# Patient Record
Sex: Female | Born: 1945 | Race: White | Hispanic: No | Marital: Married | Smoking: Never smoker
Health system: Southern US, Community
[De-identification: ages and names within clinical notes are randomized; demographics above are authoritative.]

## PROBLEM LIST (undated history)

## (undated) DIAGNOSIS — E079 Disorder of thyroid, unspecified: Secondary | ICD-10-CM

## (undated) DIAGNOSIS — C73 Malignant neoplasm of thyroid gland: Secondary | ICD-10-CM

## (undated) DIAGNOSIS — I10 Essential (primary) hypertension: Secondary | ICD-10-CM

## (undated) DIAGNOSIS — M810 Age-related osteoporosis without current pathological fracture: Secondary | ICD-10-CM

## (undated) HISTORY — DX: Malignant neoplasm of thyroid gland: C73

## (undated) HISTORY — DX: Disorder of thyroid, unspecified: E07.9

## (undated) HISTORY — DX: Age-related osteoporosis without current pathological fracture: M81.0

## (undated) HISTORY — DX: Essential (primary) hypertension: I10

## (undated) HISTORY — PX: THYROID SURGERY: SHX805

---

## 1999-12-19 ENCOUNTER — Encounter: Admission: RE | Admit: 1999-12-19 | Discharge: 1999-12-19 | Payer: Self-pay | Admitting: Obstetrics and Gynecology

## 1999-12-19 ENCOUNTER — Encounter: Payer: Self-pay | Admitting: Obstetrics and Gynecology

## 2000-12-25 ENCOUNTER — Encounter: Payer: Self-pay | Admitting: Obstetrics and Gynecology

## 2000-12-25 ENCOUNTER — Encounter: Admission: RE | Admit: 2000-12-25 | Discharge: 2000-12-25 | Payer: Self-pay | Admitting: Obstetrics and Gynecology

## 2002-01-05 ENCOUNTER — Encounter: Payer: Self-pay | Admitting: Obstetrics and Gynecology

## 2002-01-05 ENCOUNTER — Encounter: Admission: RE | Admit: 2002-01-05 | Discharge: 2002-01-05 | Payer: Self-pay | Admitting: Obstetrics and Gynecology

## 2003-01-26 ENCOUNTER — Encounter: Admission: RE | Admit: 2003-01-26 | Discharge: 2003-01-26 | Payer: Self-pay | Admitting: Obstetrics and Gynecology

## 2003-01-26 ENCOUNTER — Encounter: Payer: Self-pay | Admitting: Obstetrics and Gynecology

## 2004-02-01 ENCOUNTER — Encounter: Admission: RE | Admit: 2004-02-01 | Discharge: 2004-02-01 | Payer: Self-pay | Admitting: Obstetrics and Gynecology

## 2004-03-13 ENCOUNTER — Other Ambulatory Visit: Admission: RE | Admit: 2004-03-13 | Discharge: 2004-03-13 | Payer: Self-pay | Admitting: Obstetrics and Gynecology

## 2004-04-05 ENCOUNTER — Observation Stay (HOSPITAL_COMMUNITY): Admission: RE | Admit: 2004-04-05 | Discharge: 2004-04-06 | Payer: Self-pay

## 2004-04-05 ENCOUNTER — Encounter (INDEPENDENT_AMBULATORY_CARE_PROVIDER_SITE_OTHER): Payer: Self-pay | Admitting: *Deleted

## 2004-06-15 ENCOUNTER — Encounter (INDEPENDENT_AMBULATORY_CARE_PROVIDER_SITE_OTHER): Payer: Self-pay | Admitting: Specialist

## 2004-06-15 ENCOUNTER — Observation Stay (HOSPITAL_COMMUNITY): Admission: RE | Admit: 2004-06-15 | Discharge: 2004-06-16 | Payer: Self-pay

## 2004-07-31 ENCOUNTER — Encounter (HOSPITAL_COMMUNITY): Admission: RE | Admit: 2004-07-31 | Discharge: 2004-10-29 | Payer: Self-pay | Admitting: Endocrinology

## 2005-02-08 ENCOUNTER — Encounter: Admission: RE | Admit: 2005-02-08 | Discharge: 2005-02-08 | Payer: Self-pay | Admitting: Obstetrics and Gynecology

## 2005-08-06 ENCOUNTER — Encounter (HOSPITAL_COMMUNITY): Admission: RE | Admit: 2005-08-06 | Discharge: 2005-11-04 | Payer: Self-pay | Admitting: Endocrinology

## 2006-02-11 ENCOUNTER — Encounter: Admission: RE | Admit: 2006-02-11 | Discharge: 2006-02-11 | Payer: Self-pay | Admitting: Obstetrics and Gynecology

## 2006-08-19 ENCOUNTER — Encounter (HOSPITAL_COMMUNITY): Admission: RE | Admit: 2006-08-19 | Discharge: 2006-08-23 | Payer: Self-pay | Admitting: Endocrinology

## 2007-02-13 ENCOUNTER — Encounter: Admission: RE | Admit: 2007-02-13 | Discharge: 2007-02-13 | Payer: Self-pay | Admitting: Obstetrics and Gynecology

## 2008-02-16 ENCOUNTER — Encounter: Admission: RE | Admit: 2008-02-16 | Discharge: 2008-02-16 | Payer: Self-pay | Admitting: Obstetrics and Gynecology

## 2008-11-08 ENCOUNTER — Encounter (HOSPITAL_COMMUNITY): Admission: RE | Admit: 2008-11-08 | Discharge: 2008-12-01 | Payer: Self-pay | Admitting: Endocrinology

## 2009-03-10 ENCOUNTER — Encounter: Admission: RE | Admit: 2009-03-10 | Discharge: 2009-03-10 | Payer: Self-pay | Admitting: Endocrinology

## 2010-03-14 ENCOUNTER — Encounter: Admission: RE | Admit: 2010-03-14 | Discharge: 2010-03-14 | Payer: Self-pay | Admitting: Family Medicine

## 2010-10-20 NOTE — Op Note (Signed)
Veronica Aguirre, Veronica Aguirre NO.:  [id number]   MEDICAL RECORD NO.:  [id number]          PATIENT TYPE:  OBV   LOCATION:  0098                         FACILITY:  [facility]   PHYSICIAN:  Lorre Munroe., M.D.DATE OF BIRTH:  [birth year]   DATE OF PROCEDURE:  06/15/2004  DATE OF DISCHARGE:                                 OPERATIVE REPORT   PREOPERATIVE DIAGNOSIS:  Carcinoma of thyroid gland.   POSTOPERATIVE DIAGNOSIS:  Carcinoma of thyroid gland.   OPERATION:  Left thyroid lobectomy (completion thyroidectomy).   SURGEON:  Lebron Conners, M.D.   ASSISTANT:  Angelia Mould. Derrell Lolling, M.D.   ANESTHESIA:  Was  General.   PROCEDURE:  After the patient was monitored and anesthetized and had routine  preparation and draping of the neck with her head hyperextended slightly, I  reopened the previous incision, which was well-healed, and dissected down  through the scar tissue until I was certain I was under the platysma but  superficial to the strap muscles.  I then developed flaps cephalad to the  thyroid cartilage and caudad to the sternal notch, getting adequate mobility  of the tissues to retract them for exposure of the left lobe of thyroid  gland.  I incised the midline right down to the trachea and then identified  the remaining thyroid gland to the left side of the neck.  I bluntly  dissected the plane between the thyroid and the strap muscles and retracted  the strap muscles until I saw the left lobe of thyroid gland.  It was fairly  small.  I first dissected toward the superior pole and dissected out the  vessels individually, ligated and clipped them and divided them.  That  provided good mobility in that area.  I dissected laterally next and  mobilized the gland up out of the tracheoesophageal groove bluntly.  I  worked along the inferior pole of the gland staying very close to the  surface of the gland so as to avoid injury to the parathyroids and clipped  the vessels and  divided them.  I then got good mobility of the left lobe and  dissected it up out of the tracheoesophageal groove and bluntly dissected  out and visualized the recurrent laryngeal nerve and was careful not to harm  it.  I completed the mobilization of the gland off the trachea and oriented  it with a suture and it sent to the lab. I then got good hemostasis in all  operating areas and  packed in some Surgicel.  I closed the midline with running 3-0 Vicryl and  closed with platysma with running 3-0 Vicryl and closed the skin with  intracuticular 4-0 Vicryl and Steri-Strips.  Sponge, needle and instrument  counts were correct.  Blood loss was minimal.  The patient tolerated the  operation well.     Will   WB/MEDQ  D:  06/15/2004  T:  06/15/2004  Job:  [id number]   cc:   S. Kyra Manges, M.D.  [phone] [address]  [place]  [place] [address]  Fax: [phone]

## 2010-10-20 NOTE — Op Note (Signed)
NAMEGRETE, BOSKO NO.:  [id number]   MEDICAL RECORD NO.:  [id number]          PATIENT TYPE:  AMB   LOCATION:  DAY                          FACILITY:  [facility]   PHYSICIAN:  Lorre Munroe., M.D.DATE OF BIRTH:  01-02-1946   DATE OF PROCEDURE:  04/05/2004  DATE OF DISCHARGE:                                 OPERATIVE REPORT   PREOPERATIVE DIAGNOSES:  Benign follicular lesion right lobe of thyroid.   POSTOPERATIVE DIAGNOSES:  Benign follicular lesion right lobe of thyroid.   OPERATION:  Right thyroid lobectomy.   SURGEON:  Lebron Conners, M.D.   ASSISTANT:  Anselm Pancoast. Zachery Dakins, M.D.   ANESTHESIA:  General.   DESCRIPTION OF PROCEDURE:  After the patient was monitored and anesthetized  and had routine preparation and draping of the anterior neck, I made about a  6 cm collar incision a couple of centimeters above the clavicle and  dissected down through the fat and platysma. I then raised flaps of skin,  subcutaneous tissue and __________ cephalad to the thyroid cartilage and  caudad to the sternal notch.  I incised the midline of the neck with the  cautery and identified the thyroid isthmus and saw a large nodule to the  right of it.  I cleared the strap muscles off the anterior surface of the  right lobe and dissected laterally dividing middle veins as I approached  them after clipping them. I found that the nodule was fairly large and that  it seemed to displace most of the right lobe posteriorly and dissected  upward first and identified the superior pole vessels and clipped them with  medium clips and divided them mobilizing the superior pole. The dissection  laterally was somewhat difficult because the thyroid gland went so far  posteriorly. I meticulously dissected getting good hemostasis with clips or  cautery as needed, reflected the gland forward and noted the recurrent  laryngeal nerve and took care not to harm it. I stayed right on the thyroid  gland. I did not see any parathyroid glands. I reflected the gland on  forward and identified the inferior pole vessels and clipped and divided  them as well.  I then took the gland off the anterior surface of the  trachea. I clamped across the isthmus well to the left of the nodule and  removed the gland. I suture ligated the isthmus with 3-0 Vicryl suture. I  then irrigated the area and removed the irrigant and packed in a little  Surgicel, checked again later and saw that hemostasis was excellent.  I  received the report that this appeared to be a benign follicular nodule and  no papillary  cancer was seen.  I concluded the operation by sewing together the midline  with running 3-0 Vicryl, closed the platysma with running 3-0 Vicryl and  closing the skin with running intracuticular 4-0 Vicryl and Steri-Strips.  She tolerated the operation well.      WB/MEDQ  D:  04/05/2004  T:  04/05/2004  Job:  [id number]   cc:   S. Kyra Manges, M.D.  [phone] [address]  [place]  [place] [address]  Fax: [phone]

## 2011-02-08 ENCOUNTER — Other Ambulatory Visit: Payer: Self-pay | Admitting: Family Medicine

## 2011-02-08 DIAGNOSIS — Z1231 Encounter for screening mammogram for malignant neoplasm of breast: Secondary | ICD-10-CM

## 2011-03-16 ENCOUNTER — Ambulatory Visit
Admission: RE | Admit: 2011-03-16 | Discharge: 2011-03-16 | Disposition: A | Discharge status: Home / Self Care | Payer: Medicare Other | Source: Ambulatory Visit | Attending: Family Medicine | Admitting: Family Medicine

## 2011-03-16 DIAGNOSIS — Z1231 Encounter for screening mammogram for malignant neoplasm of breast: Secondary | ICD-10-CM

## 2012-02-07 ENCOUNTER — Other Ambulatory Visit: Payer: Self-pay | Admitting: Family Medicine

## 2012-02-07 DIAGNOSIS — Z1231 Encounter for screening mammogram for malignant neoplasm of breast: Secondary | ICD-10-CM

## 2012-03-04 ENCOUNTER — Other Ambulatory Visit: Payer: Self-pay | Admitting: Family Medicine

## 2012-03-04 DIAGNOSIS — Z780 Asymptomatic menopausal state: Secondary | ICD-10-CM

## 2012-03-04 DIAGNOSIS — M810 Age-related osteoporosis without current pathological fracture: Secondary | ICD-10-CM

## 2012-03-18 ENCOUNTER — Ambulatory Visit: Payer: Medicare Other

## 2012-03-18 ENCOUNTER — Other Ambulatory Visit: Payer: Medicare Other

## 2012-04-08 ENCOUNTER — Ambulatory Visit
Admission: RE | Admit: 2012-04-08 | Discharge: 2012-04-08 | Disposition: A | Discharge status: Home / Self Care | Payer: Medicare Other | Source: Ambulatory Visit | Attending: Family Medicine | Admitting: Family Medicine

## 2012-04-08 DIAGNOSIS — Z1231 Encounter for screening mammogram for malignant neoplasm of breast: Secondary | ICD-10-CM

## 2012-04-08 DIAGNOSIS — Z780 Asymptomatic menopausal state: Secondary | ICD-10-CM

## 2012-04-08 DIAGNOSIS — M810 Age-related osteoporosis without current pathological fracture: Secondary | ICD-10-CM

## 2013-01-07 ENCOUNTER — Ambulatory Visit (INDEPENDENT_AMBULATORY_CARE_PROVIDER_SITE_OTHER): Payer: Medicare Other | Admitting: Family Medicine

## 2013-01-07 ENCOUNTER — Telehealth: Payer: Self-pay | Admitting: Physician Assistant

## 2013-01-07 ENCOUNTER — Telehealth: Payer: Self-pay | Admitting: Family Medicine

## 2013-01-07 ENCOUNTER — Ambulatory Visit (HOSPITAL_COMMUNITY)
Admission: RE | Admit: 2013-01-07 | Discharge: 2013-01-07 | Disposition: A | Discharge status: Home / Self Care | Payer: Medicare Other | Source: Ambulatory Visit | Attending: Family Medicine | Admitting: Family Medicine

## 2013-01-07 VITALS — BP 128/74 | HR 94 | Temp 98.0°F | Resp 17 | Ht 67.0 in | Wt 200.0 lb

## 2013-01-07 DIAGNOSIS — M79609 Pain in unspecified limb: Secondary | ICD-10-CM

## 2013-01-07 DIAGNOSIS — M25562 Pain in left knee: Secondary | ICD-10-CM

## 2013-01-07 DIAGNOSIS — M25569 Pain in unspecified knee: Secondary | ICD-10-CM

## 2013-01-07 MED ORDER — PREDNISONE 20 MG PO TABS: ORAL_TABLET | ORAL | Status: DC

## 2013-01-07 NOTE — Progress Notes (Signed)
*  PRELIMINARY RESULTS* Vascular Ultrasound Left lower extremity venous duplex has been completed.  Preliminary findings: negative for DVT  Called report to [facility].     EUNICE, Shelley Cocke 01/07/2013, 7:17 PM

## 2013-01-07 NOTE — Telephone Encounter (Signed)
Patient not yet arrived for doppler study.  I called the only contact number we have for the patient and left a message.

## 2013-01-07 NOTE — Telephone Encounter (Signed)
Pt notified of negative DVT. Notified that RX for Prednisone has been escribed to [facility]. Instructed Pt to RTC if still having pain in about 1 week. Eileen Stanford

## 2013-01-07 NOTE — Addendum Note (Signed)
Addended by: Sheppard Plumber A on: 01/07/2013 05:17 PM   Modules accepted: Orders

## 2013-01-07 NOTE — Progress Notes (Addendum)
67 yo with three weeks of left calf and lower posterior thigh pain after 10 [place] flight.  Stairs are particularly bad.  Retired Runner, broadcasting/film/video.  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  H/O lumbar back surgery. No chest pain or shortness of breath No back pain.  Objective:  NAD Slightly swollen left calf.  No significant edema.  Perhaps some discomfort with calf palpation and popliteal pressure No redness or rash on legs.  Assessment:  Possible DVT  Plan;  Check venous doppler this evening: negative Pain in joint, lower leg, left - Plan: Lower Extremity Venous Duplex Left, predniSONE (DELTASONE) 20 MG tablet    Signed, Elvina Sidle, MD

## 2013-01-07 NOTE — Addendum Note (Signed)
Addended by: Elvina Sidle on: 01/07/2013 07:04 PM   Modules accepted: Orders

## 2013-01-12 ENCOUNTER — Telehealth: Payer: Self-pay

## 2013-01-12 DIAGNOSIS — M25569 Pain in unspecified knee: Secondary | ICD-10-CM

## 2013-01-12 NOTE — Telephone Encounter (Signed)
Referral made 

## 2013-01-12 NOTE — Telephone Encounter (Signed)
Patient saw Dr. Elbert Ewings about her knee.  She continues to have a lot of pain and cramping.  Says she had a hard time walking on it yesterday.  Would like a referral.  [phone]

## 2013-01-12 NOTE — Telephone Encounter (Signed)
Pt saw Dr. Elbert Ewings about her knee.  She is still having major problems with it including cramping.  [place]iel it was so bad yesterday, she couldn't really walk on it.  She would like a referral.  [phone]

## 2013-03-16 ENCOUNTER — Other Ambulatory Visit: Payer: Self-pay

## 2013-03-16 DIAGNOSIS — Z1231 Encounter for screening mammogram for malignant neoplasm of breast: Secondary | ICD-10-CM

## 2013-04-16 ENCOUNTER — Ambulatory Visit
Admission: RE | Admit: 2013-04-16 | Discharge: 2013-04-16 | Disposition: A | Discharge status: Home / Self Care | Payer: Medicare Other | Source: Ambulatory Visit

## 2013-04-16 DIAGNOSIS — Z1231 Encounter for screening mammogram for malignant neoplasm of breast: Secondary | ICD-10-CM

## 2013-06-04 HISTORY — PX: BREAST BIOPSY: SHX20

## 2014-03-17 ENCOUNTER — Other Ambulatory Visit: Payer: Self-pay

## 2014-03-17 DIAGNOSIS — Z1239 Encounter for other screening for malignant neoplasm of breast: Secondary | ICD-10-CM

## 2014-04-16 ENCOUNTER — Other Ambulatory Visit: Payer: Self-pay

## 2014-04-16 DIAGNOSIS — Z1231 Encounter for screening mammogram for malignant neoplasm of breast: Secondary | ICD-10-CM

## 2014-04-20 ENCOUNTER — Ambulatory Visit
Admission: RE | Admit: 2014-04-20 | Discharge: 2014-04-20 | Disposition: A | Discharge status: Home / Self Care | Payer: 59 | Source: Ambulatory Visit

## 2014-04-20 DIAGNOSIS — Z1231 Encounter for screening mammogram for malignant neoplasm of breast: Secondary | ICD-10-CM

## 2014-04-21 ENCOUNTER — Other Ambulatory Visit: Payer: Self-pay | Admitting: Family Medicine

## 2014-04-21 DIAGNOSIS — R928 Other abnormal and inconclusive findings on diagnostic imaging of breast: Secondary | ICD-10-CM

## 2014-05-11 ENCOUNTER — Other Ambulatory Visit: Payer: Self-pay | Admitting: Family Medicine

## 2014-05-11 ENCOUNTER — Ambulatory Visit
Admission: RE | Admit: 2014-05-11 | Discharge: 2014-05-11 | Disposition: A | Discharge status: Home / Self Care | Payer: Medicare Other | Source: Ambulatory Visit | Attending: Family Medicine | Admitting: Family Medicine

## 2014-05-11 DIAGNOSIS — R928 Other abnormal and inconclusive findings on diagnostic imaging of breast: Secondary | ICD-10-CM

## 2014-05-19 ENCOUNTER — Other Ambulatory Visit: Payer: Self-pay | Admitting: Family Medicine

## 2014-05-19 DIAGNOSIS — R928 Other abnormal and inconclusive findings on diagnostic imaging of breast: Secondary | ICD-10-CM

## 2014-05-24 ENCOUNTER — Ambulatory Visit
Admission: RE | Admit: 2014-05-24 | Discharge: 2014-05-24 | Disposition: A | Discharge status: Home / Self Care | Payer: Medicare Other | Source: Ambulatory Visit | Attending: Family Medicine | Admitting: Family Medicine

## 2014-05-24 DIAGNOSIS — R928 Other abnormal and inconclusive findings on diagnostic imaging of breast: Secondary | ICD-10-CM

## 2014-10-18 ENCOUNTER — Other Ambulatory Visit: Payer: Self-pay | Admitting: Family Medicine

## 2014-10-18 DIAGNOSIS — N641 Fat necrosis of breast: Secondary | ICD-10-CM

## 2014-11-12 ENCOUNTER — Ambulatory Visit
Admission: RE | Admit: 2014-11-12 | Discharge: 2014-11-12 | Disposition: A | Discharge status: Home / Self Care | Payer: Medicare Other | Source: Ambulatory Visit | Attending: Family Medicine | Admitting: Family Medicine

## 2014-11-12 DIAGNOSIS — N641 Fat necrosis of breast: Secondary | ICD-10-CM

## 2015-04-11 ENCOUNTER — Other Ambulatory Visit: Payer: Self-pay

## 2015-04-11 DIAGNOSIS — Z1231 Encounter for screening mammogram for malignant neoplasm of breast: Secondary | ICD-10-CM

## 2015-05-10 ENCOUNTER — Other Ambulatory Visit: Payer: Self-pay | Admitting: Family Medicine

## 2015-05-10 DIAGNOSIS — E2839 Other primary ovarian failure: Secondary | ICD-10-CM

## 2015-05-17 ENCOUNTER — Ambulatory Visit
Admission: RE | Admit: 2015-05-17 | Discharge: 2015-05-17 | Disposition: A | Discharge status: Home / Self Care | Payer: Medicare Other | Source: Ambulatory Visit

## 2015-05-17 DIAGNOSIS — Z1231 Encounter for screening mammogram for malignant neoplasm of breast: Secondary | ICD-10-CM

## 2015-06-14 ENCOUNTER — Inpatient Hospital Stay: Admission: RE | Admit: 2015-06-14 | Payer: Medicare Other | Source: Ambulatory Visit

## 2015-07-04 ENCOUNTER — Encounter: Payer: Self-pay | Admitting: Gastroenterology

## 2015-07-22 ENCOUNTER — Ambulatory Visit (AMBULATORY_SURGERY_CENTER): Payer: Self-pay | Admitting: *Deleted

## 2015-07-22 VITALS — Ht 66.0 in | Wt 215.6 lb

## 2015-07-22 DIAGNOSIS — Z1211 Encounter for screening for malignant neoplasm of colon: Secondary | ICD-10-CM

## 2015-07-22 NOTE — Progress Notes (Signed)
Denies allergies to eggs or soy products. Denies complications with sedation or anesthesia. Denies O2 use. Denies use of diet or weight loss medications.  Emmi instructions not given for colonoscopy, patient does not use the Internet

## 2015-07-27 ENCOUNTER — Ambulatory Visit
Admission: RE | Admit: 2015-07-27 | Discharge: 2015-07-27 | Disposition: A | Discharge status: Home / Self Care | Payer: Medicare Other | Source: Ambulatory Visit | Attending: Family Medicine | Admitting: Family Medicine

## 2015-07-27 DIAGNOSIS — E2839 Other primary ovarian failure: Secondary | ICD-10-CM

## 2015-08-04 ENCOUNTER — Encounter: Payer: Medicare Other | Admitting: Gastroenterology

## 2015-09-14 ENCOUNTER — Encounter: Payer: Self-pay | Admitting: Gastroenterology

## 2015-09-14 ENCOUNTER — Ambulatory Visit (AMBULATORY_SURGERY_CENTER): Payer: Medicare Other | Admitting: Gastroenterology

## 2015-09-14 VITALS — BP 121/60 | HR 77 | Temp 98.0°F | Resp 18 | Ht 67.0 in | Wt 200.0 lb

## 2015-09-14 DIAGNOSIS — D125 Benign neoplasm of sigmoid colon: Secondary | ICD-10-CM

## 2015-09-14 DIAGNOSIS — Z1211 Encounter for screening for malignant neoplasm of colon: Secondary | ICD-10-CM | POA: Diagnosis not present

## 2015-09-14 MED ORDER — SODIUM CHLORIDE 0.9 % IV SOLN: 500.0000 mL | INTRAVENOUS | Status: DC

## 2015-09-14 NOTE — Progress Notes (Signed)
A/ox3, pleased with MAC, report to RN 

## 2015-09-14 NOTE — Patient Instructions (Addendum)
Impressions/recommendations:  Polyps (handout given) Diverticulosis (handout given) High Fiber diet (handout given)  Please avoid aspirin for one week.  YOU HAD AN ENDOSCOPIC PROCEDURE TODAY AT [facility]:   Refer to the procedure report that was given to you for any specific questions about what was found during the examination.  If the procedure report does not answer your questions, please call your gastroenterologist to clarify.  If you requested that your care partner not be given the details of your procedure findings, then the procedure report has been included in a sealed envelope for you to review at your convenience later.  YOU SHOULD EXPECT: Some feelings of bloating in the abdomen. Passage of more gas than usual.  Walking can help get rid of the air that was put into your GI tract during the procedure and reduce the bloating. If you had a lower endoscopy (such as a colonoscopy or flexible sigmoidoscopy) you may notice spotting of blood in your stool or on the toilet paper. If you underwent a bowel prep for your procedure, you may not have a normal bowel movement for a few days.  Please Note:  You might notice some irritation and congestion in your nose or some drainage.  This is from the oxygen used during your procedure.  There is no need for concern and it should clear up in a day or so.  SYMPTOMS TO REPORT IMMEDIATELY:   Following lower endoscopy (colonoscopy or flexible sigmoidoscopy):  Excessive amounts of blood in the stool  Significant tenderness or worsening of abdominal pains  Swelling of the abdomen that is new, acute  Fever of 100F or higher  For urgent or emergent issues, a gastroenterologist can be reached at any hour by calling [phone].   DIET: Your first meal following the procedure should be a small meal and then it is ok to progress to your normal diet. Heavy or fried foods are harder to digest and may make you feel nauseous or bloated.   Likewise, meals heavy in dairy and vegetables can increase bloating.  Drink plenty of fluids but you should avoid alcoholic beverages for 24 hours.  ACTIVITY:  You should plan to take it easy for the rest of today and you should NOT DRIVE or use heavy machinery until tomorrow (because of the sedation medicines used during the test).    FOLLOW UP: Our staff will call the number listed on your records the next business day following your procedure to check on you and address any questions or concerns that you may have regarding the information given to you following your procedure. If we do not reach you, we will leave a message.  However, if you are feeling well and you are not experiencing any problems, there is no need to return our call.  We will assume that you have returned to your regular daily activities without incident.  If any biopsies were taken you will be contacted by phone or by letter within the next 1-3 weeks.  Please call us at [phone] if you have not heard about the biopsies in 3 weeks.    SIGNATURES/CONFIDENTIALITY: You and/or your care partner have signed paperwork which will be entered into your electronic medical record.  These signatures attest to the fact that that the information above on your After Visit Summary has been reviewed and is understood.  Full responsibility of the confidentiality of this discharge information lies with you and/or your care-partner.

## 2015-09-14 NOTE — Op Note (Signed)
[place] Patient Name: Veronica Aguirre Procedure Date: 09/14/2015 1:26 PM MRN: [id number] Endoscopist: Mallie Mussel L. Danis MD, MD Age: 70 Date of Birth: November 09, 1945 Gender: Female Procedure:                Colonoscopy Indications:              Screening for colorectal malignant neoplasm Medicines:                Monitored Anesthesia Care Procedure:                Pre-Anesthesia Assessment:                           - Prior to the procedure, a History and Physical                            was performed, and patient medications and                            allergies were reviewed. The patient's tolerance of                            previous anesthesia was also reviewed. The risks                            and benefits of the procedure and the sedation                            options and risks were discussed with the patient.                            All questions were answered, and informed consent                            was obtained. Prior Anticoagulants: The patient has                            taken no previous anticoagulant or antiplatelet                            agents. ASA Grade Assessment: II - A patient with                            mild systemic disease. After reviewing the risks                            and benefits, the patient was deemed in                            satisfactory condition to undergo the procedure.                           After obtaining informed consent, the colonoscope  was passed under direct vision. Throughout the                            procedure, the patient's blood pressure, pulse, and                            oxygen saturations were monitored continuously. The                            Model CF-HQ190L [id number]) scope was introduced                            through the anus and advanced to the the cecum,                            identified by appendiceal orifice and ileocecal                          valve. The colonoscopy was performed without                            difficulty. The patient tolerated the procedure                            well. The quality of the bowel preparation was                            excellent. The ileocecal valve, appendiceal                            orifice, and rectum were photographed. The quality                            of the bowel preparation was evaluated using the                            BBPS [facility] Bowel Preparation Scale) with scores                            of: Right Colon = 2, Transverse Colon = 3 and Left                            Colon = 3. The total BBPS score equals 8. The bowel                            preparation used was Miralax. Scope In: 1:32:43 PM Scope Out: 1:54:21 PM Scope Withdrawal Time: 0 hours 14 minutes 34 seconds  Total Procedure Duration: 0 hours 21 minutes 38 seconds  Findings:                 The perianal and digital rectal examinations were                            normal.  Multiple small-mouthed diverticula were found in                            the sigmoid colon.                           A 20 mm polyp was found in the mid sigmoid colon.                            The polyp was pedunculated. The polyp was removed                            with a hot snare. Resection and retrieval were                            complete.                           The exam was otherwise without abnormality on                            direct and retroflexion views. Complications:            No immediate complications. Estimated Blood Loss:     Estimated blood loss: none. Impression:               - Diverticulosis in the sigmoid colon.                           - One 20 mm polyp in the mid sigmoid colon, removed                            with a hot snare. Resected and retrieved.                           - The examination was otherwise normal on direct                             and retroflexion views. Recommendation:           - Patient has a contact number available for                            emergencies. The signs and symptoms of potential                            delayed complications were discussed with the                            patient. Return to normal activities tomorrow.                            Written discharge instructions were provided to the                            patient.                           -  Resume previous diet.                           - No aspirin, ibuprofen, naproxen, or other                            non-steroidal anti-inflammatory drugs for 7 days                            after polyp removal.                           - Await pathology results.                           - Repeat colonoscopy is recommended for                            surveillance. The colonoscopy date will be                            determined after pathology results from today's                            exam become available for review. Henry L. Danis MD, MD 09/14/2015 2:02:08 PM This report has been signed electronically.

## 2015-09-14 NOTE — Progress Notes (Signed)
Patient did not experience any of the following events: a burn prior to discharge; a fall within the facility; wrong site/side/patient/procedure/implant event; or a hospital transfer or hospital admission upon discharge from the facility. (G8907)  

## 2015-09-15 ENCOUNTER — Telehealth: Payer: Self-pay | Admitting: *Deleted

## 2015-09-15 NOTE — Telephone Encounter (Signed)
  Follow up Call-  Call back number 09/14/2015  Post procedure Call Back phone  # [phone]  Permission to leave phone message Yes     Patient questions:  Do you have a fever, pain , or abdominal swelling? No. Pain Score  0 *  Have you tolerated food without any problems? Yes.    Have you been able to return to your normal activities? Yes.    Do you have any questions about your discharge instructions: Diet   No. Medications  No. Follow up visit  No.  Do you have questions or concerns about your Care? No.  Actions: * If pain score is 4 or above: No action needed, pain <4.

## 2015-09-19 ENCOUNTER — Encounter: Payer: Self-pay | Admitting: Gastroenterology

## 2016-04-16 ENCOUNTER — Other Ambulatory Visit: Payer: Self-pay | Admitting: Family Medicine

## 2016-04-16 DIAGNOSIS — Z1231 Encounter for screening mammogram for malignant neoplasm of breast: Secondary | ICD-10-CM

## 2016-05-24 ENCOUNTER — Ambulatory Visit
Admission: RE | Admit: 2016-05-24 | Discharge: 2016-05-24 | Disposition: A | Discharge status: Home / Self Care | Payer: Medicare Other | Source: Ambulatory Visit | Attending: Family Medicine | Admitting: Family Medicine

## 2016-05-24 DIAGNOSIS — Z1231 Encounter for screening mammogram for malignant neoplasm of breast: Secondary | ICD-10-CM

## 2017-04-16 ENCOUNTER — Other Ambulatory Visit: Payer: Self-pay | Admitting: Family Medicine

## 2017-04-16 DIAGNOSIS — Z1231 Encounter for screening mammogram for malignant neoplasm of breast: Secondary | ICD-10-CM

## 2017-05-31 ENCOUNTER — Ambulatory Visit
Admission: RE | Admit: 2017-05-31 | Discharge: 2017-05-31 | Disposition: A | Discharge status: Home / Self Care | Payer: Medicare Other | Source: Ambulatory Visit | Attending: Family Medicine | Admitting: Family Medicine

## 2017-05-31 DIAGNOSIS — Z1231 Encounter for screening mammogram for malignant neoplasm of breast: Secondary | ICD-10-CM

## 2017-08-31 ENCOUNTER — Encounter (HOSPITAL_COMMUNITY): Payer: Self-pay | Admitting: Nurse Practitioner

## 2017-08-31 ENCOUNTER — Emergency Department (HOSPITAL_COMMUNITY)
Admission: EM | Admit: 2017-08-31 | Discharge: 2017-08-31 | Disposition: A | Discharge status: Home / Self Care | Payer: Medicare Other | Attending: Emergency Medicine | Admitting: Emergency Medicine

## 2017-08-31 DIAGNOSIS — X58XXXA Exposure to other specified factors, initial encounter: Secondary | ICD-10-CM | POA: Diagnosis not present

## 2017-08-31 DIAGNOSIS — Y998 Other external cause status: Secondary | ICD-10-CM | POA: Insufficient documentation

## 2017-08-31 DIAGNOSIS — Z5321 Procedure and treatment not carried out due to patient leaving prior to being seen by health care provider: Secondary | ICD-10-CM | POA: Diagnosis not present

## 2017-08-31 DIAGNOSIS — Y9389 Activity, other specified: Secondary | ICD-10-CM | POA: Insufficient documentation

## 2017-08-31 DIAGNOSIS — T189XXA Foreign body of alimentary tract, part unspecified, initial encounter: Secondary | ICD-10-CM | POA: Insufficient documentation

## 2017-08-31 DIAGNOSIS — Y929 Unspecified place or not applicable: Secondary | ICD-10-CM | POA: Insufficient documentation

## 2017-08-31 NOTE — ED Triage Notes (Signed)
Pt states " a pill she swallowed may have gone down the wrong way." Adds that she is feeling it in the chest area, denies hx of esophageal stricture.

## 2017-10-29 ENCOUNTER — Encounter: Payer: Medicare Other | Attending: Family Medicine | Admitting: Registered"

## 2017-10-29 DIAGNOSIS — E1165 Type 2 diabetes mellitus with hyperglycemia: Secondary | ICD-10-CM | POA: Diagnosis not present

## 2017-10-29 DIAGNOSIS — Z713 Dietary counseling and surveillance: Secondary | ICD-10-CM | POA: Diagnosis not present

## 2017-10-29 DIAGNOSIS — E119 Type 2 diabetes mellitus without complications: Secondary | ICD-10-CM | POA: Insufficient documentation

## 2017-10-29 NOTE — Progress Notes (Signed)
Diabetes Self-Management Education  Visit Type: First/Initial  Appt. Start Time: 0930 Appt. End Time: 1030  10/29/2017  Veronica Aguirre, identified by name and date of birth, is a 72 y.o. female with a diagnosis of Diabetes: Type 2.   ASSESSMENT Pt states she has been prediabetic for years and doesn't understand why it changed. Pt states she does not want to take medications and has made some changes including not having a snack before bed (was having PB crackers, milk OR vanilla wafers) Cut back bread and sweets. Pt states other than her love of sweets, she eats a pretty healthy diet including whole grains.  Pt states she has been exercising for years, does silver sneakers 2x week but does not like exercising due to back pain and knee pain. Pt states if she has to walk very far she uses a walker due to fear of falling and breaking bones. Pt states it is difficult for her to get up and down, and has to push herself out of a chair. Pt states she has done water aerobics before and enjoyed, but doesn't like it now because it takes so much work getting in and out of the pool. Pt states she used to garden, but cannot do that now. Pt states she intends to get out her exercise videos which she has not used in over a year to get more exercise in.  Diabetes Self-Management Education - 10/29/17 0929      Visit Information   Visit Type  First/Initial      Initial Visit   Diabetes Type  Type 2    Are you currently following a meal plan?  Yes    What type of meal plan do you follow?  low sugar healthy    Are you taking your medications as prescribed?  Not on Medications    Date Diagnosed  May 2019      Health Coping   How would you rate your overall health?  Good      Psychosocial Assessment   Patient Belief/Attitude about Diabetes  Motivated to manage diabetes    How often do you need to have someone help you when you read instructions, pamphlets, or other written materials from your doctor or  pharmacy?  1 - Never    What is the last grade level you completed in school?  masters      Complications   Last HgB A1C per patient/outside source  7.7 %    How often do you check your blood sugar?  1-2 times/day    Fasting Blood glucose range (mg/dL)  70-129;130-179 126-131    Postprandial Blood glucose range (mg/dL)  130-179    Number of hypoglycemic episodes per month  0    Number of hyperglycemic episodes per week  0    Have you had a dilated eye exam in the past 12 months?  Yes    Have you had a dental exam in the past 12 months?  Yes    Are you checking your feet?  No      Dietary Intake   Breakfast  bran flakes, mini wheats, fruit, nuts, milk, sometimes PB cracker, bagel cream cheese    Snack (morning)  none    Lunch  cream clam chowder, PB crackers,    Snack (afternoon)  nuts OR chips OR PB crackers    Dinner  meat, vegetable (no bread) maybe salad    Snack (evening)  stopped     Beverage(s)  coffee cream splenda, diet coke, water      Exercise   Exercise Type  Light (walking / raking leaves)    How many days per week to you exercise?  2    How many minutes per day do you exercise?  45    Total minutes per week of exercise  90      Patient Education   Previous Diabetes Education  No    Disease state   Definition of diabetes, type 1 and 2, and the diagnosis of diabetes    Nutrition management   Role of diet in the treatment of diabetes and the relationship between the three main macronutrients and blood glucose level    Physical activity and exercise   Role of exercise on diabetes management, blood pressure control and cardiac health.    Monitoring  Identified appropriate SMBG and/or A1C goals.      Individualized Goals (developed by patient)   Nutrition  General guidelines for healthy choices and portions discussed    Physical Activity  Exercise 3-5 times per week    Monitoring   test my blood glucose as discussed    Reducing Risk  Other (comment) have fish more often  (2-3x week)      Outcomes   Expected Outcomes  Demonstrated interest in learning. Expect positive outcomes    Future DMSE  PRN    Program Status  Completed     Individualized Plan for Diabetes Self-Management Training:   Learning Objective:  Patient will have a greater understanding of diabetes self-management. Patient education plan is to attend individual and/or group sessions per assessed needs and concerns.   Patient Instructions  Continue to have balanced meals and snacks Continue to have fish (tuna and salmon) on regular basis Continue including whole grains in your diet Continue exercising as tolerated, consider trying the stationery bike. Continue checking your blood sugar, take your log to your MD appointment.   Expected Outcomes:  Demonstrated interest in learning. Expect positive outcomes  Education material provided: Living Well with Diabetes and A1C conversion sheet  If problems or questions, patient to contact team via:  Phone  Future DSME appointment: PRN

## 2017-10-29 NOTE — Patient Instructions (Addendum)
Continue to have balanced meals and snacks Continue to have fish (tuna and salmon) on regular basis Continue including whole grains in your diet Continue exercising as tolerated, consider trying the stationery bike. Continue checking your blood sugar, take your log to your MD appointment.

## 2018-04-23 ENCOUNTER — Other Ambulatory Visit: Payer: Self-pay | Admitting: Family Medicine

## 2018-04-23 DIAGNOSIS — Z1231 Encounter for screening mammogram for malignant neoplasm of breast: Secondary | ICD-10-CM

## 2018-05-13 ENCOUNTER — Other Ambulatory Visit: Payer: Self-pay | Admitting: Family Medicine

## 2018-05-13 DIAGNOSIS — E2839 Other primary ovarian failure: Secondary | ICD-10-CM

## 2018-06-10 ENCOUNTER — Ambulatory Visit
Admission: RE | Admit: 2018-06-10 | Discharge: 2018-06-10 | Disposition: A | Discharge status: Home / Self Care | Payer: Medicare Other | Source: Ambulatory Visit | Attending: Family Medicine | Admitting: Family Medicine

## 2018-06-10 DIAGNOSIS — Z1231 Encounter for screening mammogram for malignant neoplasm of breast: Secondary | ICD-10-CM

## 2018-07-28 ENCOUNTER — Other Ambulatory Visit: Payer: Medicare Other

## 2018-08-25 ENCOUNTER — Other Ambulatory Visit: Payer: Medicare Other

## 2018-10-10 ENCOUNTER — Encounter: Payer: Self-pay | Admitting: Gastroenterology

## 2018-10-21 ENCOUNTER — Other Ambulatory Visit: Payer: Medicare Other

## 2018-10-30 ENCOUNTER — Other Ambulatory Visit: Payer: Self-pay

## 2018-10-30 ENCOUNTER — Telehealth: Payer: Self-pay | Admitting: *Deleted

## 2018-10-30 NOTE — Telephone Encounter (Signed)
Called this patient for Previsit for upcoming Colonoscopy appointment: June 11,2020. Immediately the patient stated she had a family emergency arise and she wants to reschedule in one year, May of 2021.Patient wants a recall placed for that time, not any earlier. Patient related family concerns and ongoing COVID crisis as reasons. Patient aware that this procedure was scheduled for hx of colon polyps. Reviewed symptoms to report to [place] with patient verbalizing understanding. Patient adamant to cancel procedure at this time

## 2018-11-13 ENCOUNTER — Encounter: Payer: Medicare Other | Admitting: Gastroenterology

## 2018-12-12 ENCOUNTER — Other Ambulatory Visit: Payer: Self-pay

## 2018-12-12 ENCOUNTER — Ambulatory Visit
Admission: RE | Admit: 2018-12-12 | Discharge: 2018-12-12 | Disposition: A | Discharge status: Home / Self Care | Payer: Medicare Other | Source: Ambulatory Visit | Attending: Family Medicine | Admitting: Family Medicine

## 2018-12-12 DIAGNOSIS — E2839 Other primary ovarian failure: Secondary | ICD-10-CM

## 2019-05-11 ENCOUNTER — Other Ambulatory Visit: Payer: Self-pay | Admitting: Family Medicine

## 2019-05-11 DIAGNOSIS — Z1231 Encounter for screening mammogram for malignant neoplasm of breast: Secondary | ICD-10-CM

## 2019-06-30 ENCOUNTER — Ambulatory Visit: Payer: Medicare Other

## 2019-07-01 ENCOUNTER — Ambulatory Visit: Payer: Medicare PPO

## 2019-07-09 ENCOUNTER — Ambulatory Visit
Admission: RE | Admit: 2019-07-09 | Discharge: 2019-07-09 | Disposition: A | Discharge status: Home / Self Care | Payer: Medicare PPO | Source: Ambulatory Visit | Attending: Family Medicine | Admitting: Family Medicine

## 2019-07-09 ENCOUNTER — Other Ambulatory Visit: Payer: Self-pay

## 2019-07-09 DIAGNOSIS — Z1231 Encounter for screening mammogram for malignant neoplasm of breast: Secondary | ICD-10-CM

## 2020-05-09 DIAGNOSIS — E890 Postprocedural hypothyroidism: Secondary | ICD-10-CM | POA: Diagnosis not present

## 2020-06-07 DIAGNOSIS — E890 Postprocedural hypothyroidism: Secondary | ICD-10-CM | POA: Diagnosis not present

## 2020-06-07 DIAGNOSIS — I10 Essential (primary) hypertension: Secondary | ICD-10-CM | POA: Diagnosis not present

## 2020-06-07 DIAGNOSIS — M179 Osteoarthritis of knee, unspecified: Secondary | ICD-10-CM | POA: Diagnosis not present

## 2020-06-07 DIAGNOSIS — N3281 Overactive bladder: Secondary | ICD-10-CM | POA: Diagnosis not present

## 2020-06-07 DIAGNOSIS — E1165 Type 2 diabetes mellitus with hyperglycemia: Secondary | ICD-10-CM | POA: Diagnosis not present

## 2020-06-07 DIAGNOSIS — E7800 Pure hypercholesterolemia, unspecified: Secondary | ICD-10-CM | POA: Diagnosis not present

## 2020-06-08 DIAGNOSIS — N3946 Mixed incontinence: Secondary | ICD-10-CM | POA: Diagnosis not present

## 2020-06-08 DIAGNOSIS — N3281 Overactive bladder: Secondary | ICD-10-CM | POA: Diagnosis not present

## 2020-06-09 ENCOUNTER — Other Ambulatory Visit: Payer: Self-pay | Admitting: Family Medicine

## 2020-06-09 DIAGNOSIS — Z1231 Encounter for screening mammogram for malignant neoplasm of breast: Secondary | ICD-10-CM

## 2020-06-28 DIAGNOSIS — H353121 Nonexudative age-related macular degeneration, left eye, early dry stage: Secondary | ICD-10-CM | POA: Diagnosis not present

## 2020-06-28 DIAGNOSIS — E119 Type 2 diabetes mellitus without complications: Secondary | ICD-10-CM | POA: Diagnosis not present

## 2020-07-20 ENCOUNTER — Ambulatory Visit: Payer: Medicare PPO

## 2020-08-09 DIAGNOSIS — E7800 Pure hypercholesterolemia, unspecified: Secondary | ICD-10-CM | POA: Diagnosis not present

## 2020-08-09 DIAGNOSIS — E039 Hypothyroidism, unspecified: Secondary | ICD-10-CM | POA: Diagnosis not present

## 2020-08-09 DIAGNOSIS — E1165 Type 2 diabetes mellitus with hyperglycemia: Secondary | ICD-10-CM | POA: Diagnosis not present

## 2020-08-30 ENCOUNTER — Ambulatory Visit
Admission: RE | Admit: 2020-08-30 | Discharge: 2020-08-30 | Disposition: A | Discharge status: Home / Self Care | Payer: Medicare PPO | Source: Ambulatory Visit | Attending: Family Medicine | Admitting: Family Medicine

## 2020-08-30 ENCOUNTER — Other Ambulatory Visit: Payer: Self-pay

## 2020-08-30 DIAGNOSIS — Z1231 Encounter for screening mammogram for malignant neoplasm of breast: Secondary | ICD-10-CM

## 2020-10-20 DIAGNOSIS — J069 Acute upper respiratory infection, unspecified: Secondary | ICD-10-CM | POA: Diagnosis not present

## 2020-10-21 DIAGNOSIS — Z0000 Encounter for general adult medical examination without abnormal findings: Secondary | ICD-10-CM | POA: Diagnosis not present

## 2020-10-21 DIAGNOSIS — Z1389 Encounter for screening for other disorder: Secondary | ICD-10-CM | POA: Diagnosis not present

## 2021-01-03 DIAGNOSIS — H353121 Nonexudative age-related macular degeneration, left eye, early dry stage: Secondary | ICD-10-CM | POA: Diagnosis not present

## 2021-03-07 DIAGNOSIS — D225 Melanocytic nevi of trunk: Secondary | ICD-10-CM | POA: Diagnosis not present

## 2021-03-07 DIAGNOSIS — X32XXXA Exposure to sunlight, initial encounter: Secondary | ICD-10-CM | POA: Diagnosis not present

## 2021-03-07 DIAGNOSIS — L280 Lichen simplex chronicus: Secondary | ICD-10-CM | POA: Diagnosis not present

## 2021-03-07 DIAGNOSIS — L570 Actinic keratosis: Secondary | ICD-10-CM | POA: Diagnosis not present

## 2021-03-07 DIAGNOSIS — L308 Other specified dermatitis: Secondary | ICD-10-CM | POA: Diagnosis not present

## 2021-03-21 DIAGNOSIS — L438 Other lichen planus: Secondary | ICD-10-CM | POA: Diagnosis not present

## 2021-07-03 DIAGNOSIS — H353121 Nonexudative age-related macular degeneration, left eye, early dry stage: Secondary | ICD-10-CM | POA: Diagnosis not present

## 2021-07-03 DIAGNOSIS — E119 Type 2 diabetes mellitus without complications: Secondary | ICD-10-CM | POA: Diagnosis not present

## 2021-07-21 ENCOUNTER — Other Ambulatory Visit: Payer: Self-pay | Admitting: Family Medicine

## 2021-07-21 DIAGNOSIS — Z1231 Encounter for screening mammogram for malignant neoplasm of breast: Secondary | ICD-10-CM

## 2021-07-24 DIAGNOSIS — N3946 Mixed incontinence: Secondary | ICD-10-CM | POA: Diagnosis not present

## 2021-07-24 DIAGNOSIS — R8271 Bacteriuria: Secondary | ICD-10-CM | POA: Diagnosis not present

## 2021-07-24 DIAGNOSIS — N3281 Overactive bladder: Secondary | ICD-10-CM | POA: Diagnosis not present

## 2021-08-31 ENCOUNTER — Ambulatory Visit: Payer: Medicare PPO

## 2021-09-05 ENCOUNTER — Ambulatory Visit
Admission: RE | Admit: 2021-09-05 | Discharge: 2021-09-05 | Disposition: A | Discharge status: Home / Self Care | Payer: Medicare PPO | Source: Ambulatory Visit | Attending: Family Medicine | Admitting: Family Medicine

## 2021-09-05 DIAGNOSIS — Z1231 Encounter for screening mammogram for malignant neoplasm of breast: Secondary | ICD-10-CM | POA: Diagnosis not present

## 2021-09-06 DIAGNOSIS — L821 Other seborrheic keratosis: Secondary | ICD-10-CM | POA: Diagnosis not present

## 2021-09-06 DIAGNOSIS — L308 Other specified dermatitis: Secondary | ICD-10-CM | POA: Diagnosis not present

## 2021-09-25 DIAGNOSIS — E1165 Type 2 diabetes mellitus with hyperglycemia: Secondary | ICD-10-CM | POA: Diagnosis not present

## 2021-09-25 DIAGNOSIS — Z23 Encounter for immunization: Secondary | ICD-10-CM | POA: Diagnosis not present

## 2021-09-25 DIAGNOSIS — E890 Postprocedural hypothyroidism: Secondary | ICD-10-CM | POA: Diagnosis not present

## 2021-09-25 DIAGNOSIS — J069 Acute upper respiratory infection, unspecified: Secondary | ICD-10-CM | POA: Diagnosis not present

## 2021-09-25 DIAGNOSIS — I10 Essential (primary) hypertension: Secondary | ICD-10-CM | POA: Diagnosis not present

## 2021-09-25 DIAGNOSIS — E7800 Pure hypercholesterolemia, unspecified: Secondary | ICD-10-CM | POA: Diagnosis not present

## 2021-10-24 DIAGNOSIS — Z1389 Encounter for screening for other disorder: Secondary | ICD-10-CM | POA: Diagnosis not present

## 2021-10-24 DIAGNOSIS — Z0000 Encounter for general adult medical examination without abnormal findings: Secondary | ICD-10-CM | POA: Diagnosis not present

## 2022-05-07 DIAGNOSIS — R350 Frequency of micturition: Secondary | ICD-10-CM | POA: Diagnosis not present

## 2022-05-07 DIAGNOSIS — N390 Urinary tract infection, site not specified: Secondary | ICD-10-CM | POA: Diagnosis not present

## 2022-05-17 DIAGNOSIS — M170 Bilateral primary osteoarthritis of knee: Secondary | ICD-10-CM | POA: Diagnosis not present

## 2022-05-17 DIAGNOSIS — M1612 Unilateral primary osteoarthritis, left hip: Secondary | ICD-10-CM | POA: Diagnosis not present

## 2022-05-17 DIAGNOSIS — M1712 Unilateral primary osteoarthritis, left knee: Secondary | ICD-10-CM | POA: Diagnosis not present

## 2022-05-17 DIAGNOSIS — M1711 Unilateral primary osteoarthritis, right knee: Secondary | ICD-10-CM | POA: Diagnosis not present

## 2022-06-12 DIAGNOSIS — R262 Difficulty in walking, not elsewhere classified: Secondary | ICD-10-CM | POA: Diagnosis not present

## 2022-06-12 DIAGNOSIS — M25562 Pain in left knee: Secondary | ICD-10-CM | POA: Diagnosis not present

## 2022-06-12 DIAGNOSIS — R531 Weakness: Secondary | ICD-10-CM | POA: Diagnosis not present

## 2022-06-15 DIAGNOSIS — R531 Weakness: Secondary | ICD-10-CM | POA: Diagnosis not present

## 2022-06-15 DIAGNOSIS — M25562 Pain in left knee: Secondary | ICD-10-CM | POA: Diagnosis not present

## 2022-06-15 DIAGNOSIS — R262 Difficulty in walking, not elsewhere classified: Secondary | ICD-10-CM | POA: Diagnosis not present

## 2022-06-19 DIAGNOSIS — R262 Difficulty in walking, not elsewhere classified: Secondary | ICD-10-CM | POA: Diagnosis not present

## 2022-06-19 DIAGNOSIS — R531 Weakness: Secondary | ICD-10-CM | POA: Diagnosis not present

## 2022-06-19 DIAGNOSIS — M25562 Pain in left knee: Secondary | ICD-10-CM | POA: Diagnosis not present

## 2022-06-21 DIAGNOSIS — M25562 Pain in left knee: Secondary | ICD-10-CM | POA: Diagnosis not present

## 2022-06-21 DIAGNOSIS — R531 Weakness: Secondary | ICD-10-CM | POA: Diagnosis not present

## 2022-06-21 DIAGNOSIS — R262 Difficulty in walking, not elsewhere classified: Secondary | ICD-10-CM | POA: Diagnosis not present

## 2022-06-22 DIAGNOSIS — X32XXXD Exposure to sunlight, subsequent encounter: Secondary | ICD-10-CM | POA: Diagnosis not present

## 2022-06-22 DIAGNOSIS — L218 Other seborrheic dermatitis: Secondary | ICD-10-CM | POA: Diagnosis not present

## 2022-06-22 DIAGNOSIS — L570 Actinic keratosis: Secondary | ICD-10-CM | POA: Diagnosis not present

## 2022-06-25 DIAGNOSIS — M179 Osteoarthritis of knee, unspecified: Secondary | ICD-10-CM | POA: Diagnosis not present

## 2022-06-25 DIAGNOSIS — E890 Postprocedural hypothyroidism: Secondary | ICD-10-CM | POA: Diagnosis not present

## 2022-06-25 DIAGNOSIS — E1165 Type 2 diabetes mellitus with hyperglycemia: Secondary | ICD-10-CM | POA: Diagnosis not present

## 2022-06-25 DIAGNOSIS — I10 Essential (primary) hypertension: Secondary | ICD-10-CM | POA: Diagnosis not present

## 2022-06-25 DIAGNOSIS — E7800 Pure hypercholesterolemia, unspecified: Secondary | ICD-10-CM | POA: Diagnosis not present

## 2022-06-26 DIAGNOSIS — M25562 Pain in left knee: Secondary | ICD-10-CM | POA: Diagnosis not present

## 2022-06-28 ENCOUNTER — Other Ambulatory Visit: Payer: Self-pay | Admitting: Orthopedic Surgery

## 2022-06-28 DIAGNOSIS — M25562 Pain in left knee: Secondary | ICD-10-CM

## 2022-07-06 ENCOUNTER — Encounter: Payer: Self-pay | Admitting: Orthopedic Surgery

## 2022-07-07 ENCOUNTER — Ambulatory Visit
Admission: RE | Admit: 2022-07-07 | Discharge: 2022-07-07 | Disposition: A | Discharge status: Home / Self Care | Payer: Medicare PPO | Source: Ambulatory Visit | Attending: Orthopedic Surgery | Admitting: Orthopedic Surgery

## 2022-07-07 DIAGNOSIS — M25562 Pain in left knee: Secondary | ICD-10-CM

## 2022-07-17 DIAGNOSIS — S83282A Other tear of lateral meniscus, current injury, left knee, initial encounter: Secondary | ICD-10-CM | POA: Diagnosis not present

## 2022-07-25 DIAGNOSIS — M25562 Pain in left knee: Secondary | ICD-10-CM | POA: Diagnosis not present

## 2022-07-25 DIAGNOSIS — R531 Weakness: Secondary | ICD-10-CM | POA: Diagnosis not present

## 2022-07-25 DIAGNOSIS — R262 Difficulty in walking, not elsewhere classified: Secondary | ICD-10-CM | POA: Diagnosis not present

## 2022-07-27 DIAGNOSIS — Z87440 Personal history of urinary (tract) infections: Secondary | ICD-10-CM | POA: Diagnosis not present

## 2022-07-27 DIAGNOSIS — N3946 Mixed incontinence: Secondary | ICD-10-CM | POA: Diagnosis not present

## 2022-07-27 DIAGNOSIS — R351 Nocturia: Secondary | ICD-10-CM | POA: Diagnosis not present

## 2022-07-30 DIAGNOSIS — R262 Difficulty in walking, not elsewhere classified: Secondary | ICD-10-CM | POA: Diagnosis not present

## 2022-07-30 DIAGNOSIS — R531 Weakness: Secondary | ICD-10-CM | POA: Diagnosis not present

## 2022-07-30 DIAGNOSIS — M25562 Pain in left knee: Secondary | ICD-10-CM | POA: Diagnosis not present

## 2022-07-31 ENCOUNTER — Other Ambulatory Visit: Payer: Self-pay | Admitting: Family Medicine

## 2022-07-31 DIAGNOSIS — Z1231 Encounter for screening mammogram for malignant neoplasm of breast: Secondary | ICD-10-CM

## 2022-08-03 DIAGNOSIS — R531 Weakness: Secondary | ICD-10-CM | POA: Diagnosis not present

## 2022-08-03 DIAGNOSIS — M25562 Pain in left knee: Secondary | ICD-10-CM | POA: Diagnosis not present

## 2022-08-03 DIAGNOSIS — R262 Difficulty in walking, not elsewhere classified: Secondary | ICD-10-CM | POA: Diagnosis not present

## 2022-08-07 DIAGNOSIS — R531 Weakness: Secondary | ICD-10-CM | POA: Diagnosis not present

## 2022-08-07 DIAGNOSIS — M25562 Pain in left knee: Secondary | ICD-10-CM | POA: Diagnosis not present

## 2022-08-07 DIAGNOSIS — R262 Difficulty in walking, not elsewhere classified: Secondary | ICD-10-CM | POA: Diagnosis not present

## 2022-08-14 DIAGNOSIS — R262 Difficulty in walking, not elsewhere classified: Secondary | ICD-10-CM | POA: Diagnosis not present

## 2022-08-14 DIAGNOSIS — R531 Weakness: Secondary | ICD-10-CM | POA: Diagnosis not present

## 2022-08-14 DIAGNOSIS — M25562 Pain in left knee: Secondary | ICD-10-CM | POA: Diagnosis not present

## 2022-08-17 DIAGNOSIS — M25562 Pain in left knee: Secondary | ICD-10-CM | POA: Diagnosis not present

## 2022-08-17 DIAGNOSIS — R531 Weakness: Secondary | ICD-10-CM | POA: Diagnosis not present

## 2022-08-17 DIAGNOSIS — R262 Difficulty in walking, not elsewhere classified: Secondary | ICD-10-CM | POA: Diagnosis not present

## 2022-08-21 DIAGNOSIS — R531 Weakness: Secondary | ICD-10-CM | POA: Diagnosis not present

## 2022-08-21 DIAGNOSIS — R262 Difficulty in walking, not elsewhere classified: Secondary | ICD-10-CM | POA: Diagnosis not present

## 2022-08-21 DIAGNOSIS — M25562 Pain in left knee: Secondary | ICD-10-CM | POA: Diagnosis not present

## 2022-08-23 DIAGNOSIS — M25562 Pain in left knee: Secondary | ICD-10-CM | POA: Diagnosis not present

## 2022-08-24 DIAGNOSIS — R262 Difficulty in walking, not elsewhere classified: Secondary | ICD-10-CM | POA: Diagnosis not present

## 2022-08-24 DIAGNOSIS — M25562 Pain in left knee: Secondary | ICD-10-CM | POA: Diagnosis not present

## 2022-08-24 DIAGNOSIS — R531 Weakness: Secondary | ICD-10-CM | POA: Diagnosis not present

## 2022-08-28 DIAGNOSIS — E7800 Pure hypercholesterolemia, unspecified: Secondary | ICD-10-CM | POA: Diagnosis not present

## 2022-08-28 DIAGNOSIS — Z131 Encounter for screening for diabetes mellitus: Secondary | ICD-10-CM | POA: Diagnosis not present

## 2022-08-28 DIAGNOSIS — Z136 Encounter for screening for cardiovascular disorders: Secondary | ICD-10-CM | POA: Diagnosis not present

## 2022-08-28 DIAGNOSIS — Z23 Encounter for immunization: Secondary | ICD-10-CM | POA: Diagnosis not present

## 2022-08-28 DIAGNOSIS — Z0000 Encounter for general adult medical examination without abnormal findings: Secondary | ICD-10-CM | POA: Diagnosis not present

## 2022-09-18 ENCOUNTER — Ambulatory Visit
Admission: RE | Admit: 2022-09-18 | Discharge: 2022-09-18 | Disposition: A | Discharge status: Home / Self Care | Payer: Medicare PPO | Source: Ambulatory Visit | Attending: Family Medicine | Admitting: Family Medicine

## 2022-09-18 DIAGNOSIS — Z1231 Encounter for screening mammogram for malignant neoplasm of breast: Secondary | ICD-10-CM | POA: Diagnosis not present

## 2022-09-19 DIAGNOSIS — E119 Type 2 diabetes mellitus without complications: Secondary | ICD-10-CM | POA: Diagnosis not present

## 2022-10-30 DIAGNOSIS — Z23 Encounter for immunization: Secondary | ICD-10-CM | POA: Diagnosis not present

## 2022-10-30 DIAGNOSIS — Z0000 Encounter for general adult medical examination without abnormal findings: Secondary | ICD-10-CM | POA: Diagnosis not present

## 2022-10-30 DIAGNOSIS — E669 Obesity, unspecified: Secondary | ICD-10-CM | POA: Diagnosis not present

## 2022-10-30 DIAGNOSIS — Z1389 Encounter for screening for other disorder: Secondary | ICD-10-CM | POA: Diagnosis not present

## 2023-03-15 DIAGNOSIS — R3989 Other symptoms and signs involving the genitourinary system: Secondary | ICD-10-CM | POA: Diagnosis not present

## 2023-03-15 DIAGNOSIS — R8290 Unspecified abnormal findings in urine: Secondary | ICD-10-CM | POA: Diagnosis not present

## 2023-03-15 DIAGNOSIS — B356 Tinea cruris: Secondary | ICD-10-CM | POA: Diagnosis not present

## 2023-03-29 DIAGNOSIS — N390 Urinary tract infection, site not specified: Secondary | ICD-10-CM | POA: Diagnosis not present

## 2023-03-29 DIAGNOSIS — B3731 Acute candidiasis of vulva and vagina: Secondary | ICD-10-CM | POA: Diagnosis not present

## 2023-03-29 DIAGNOSIS — R300 Dysuria: Secondary | ICD-10-CM | POA: Diagnosis not present

## 2023-04-02 IMAGING — MG MM DIGITAL SCREENING BILAT W/ TOMO AND CAD
8 series · 8 of 24 positions shown · non-contrast
Comparison: Previous exam(s).

CLINICAL DATA: Screening.

EXAM:
DIGITAL SCREENING BILATERAL MAMMOGRAM WITH TOMOSYNTHESIS AND CAD
TECHNIQUE: Bilateral screening digital craniocaudal and mediolateral oblique
mammograms were obtained. Bilateral screening digital breast
tomosynthesis was performed. The images were evaluated with
computer-aided detection.

[L MLO synth-2D]
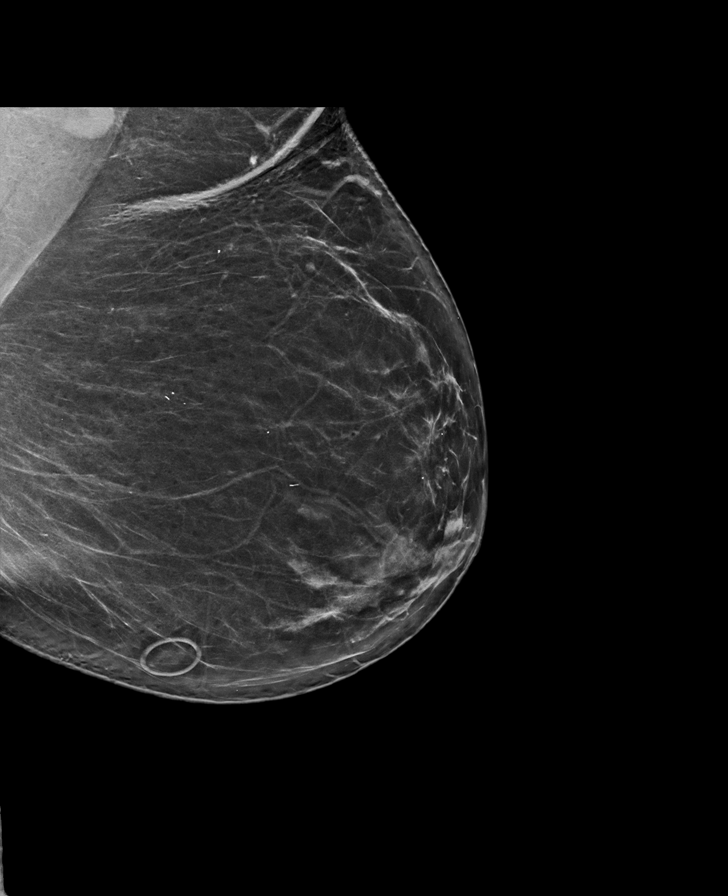

[R MLO synth-2D]
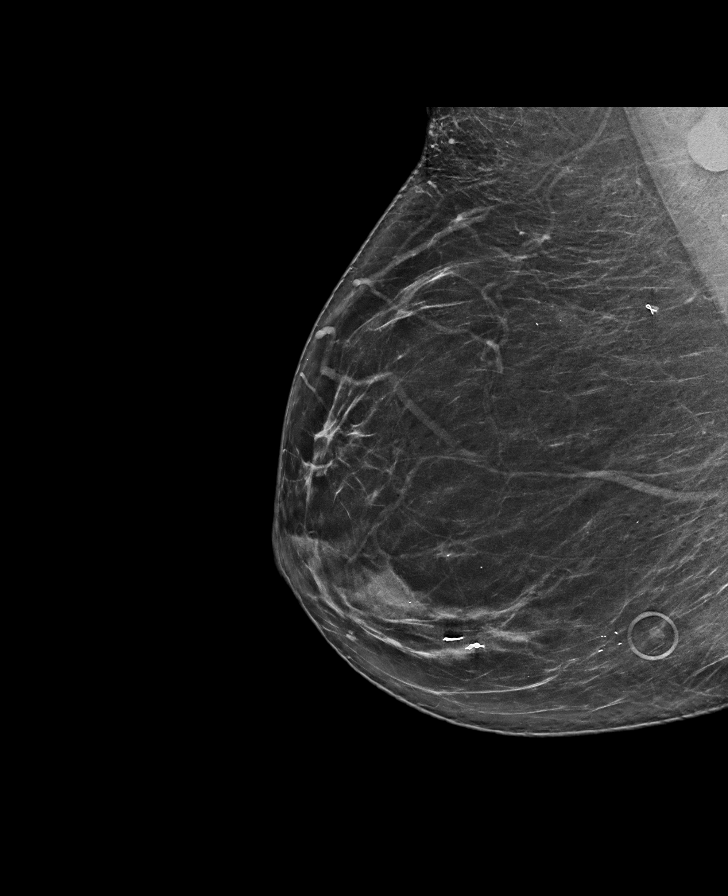

[L CC synth-2D]
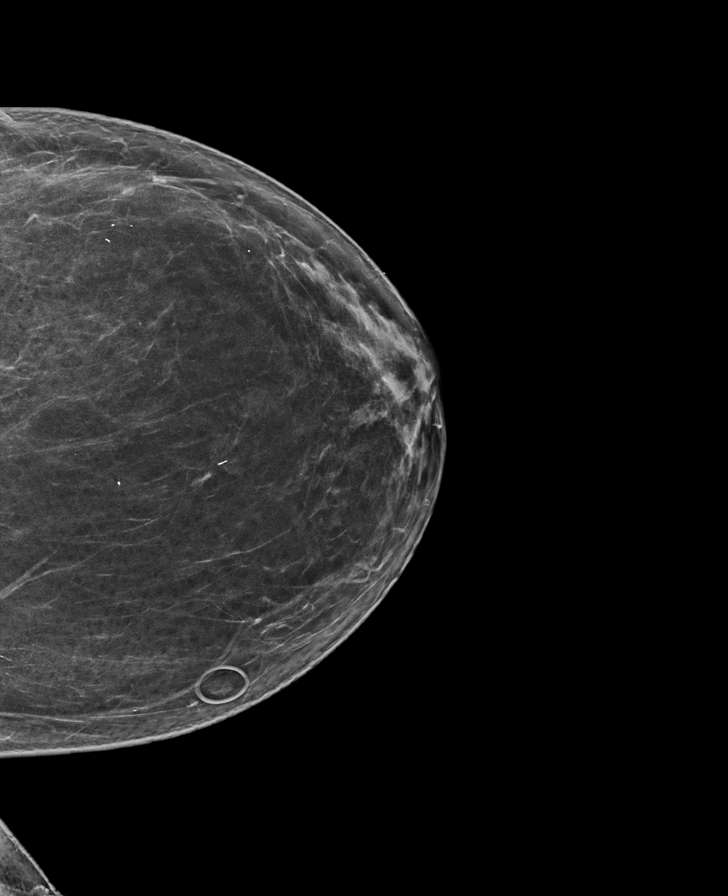

[R CC synth-2D]
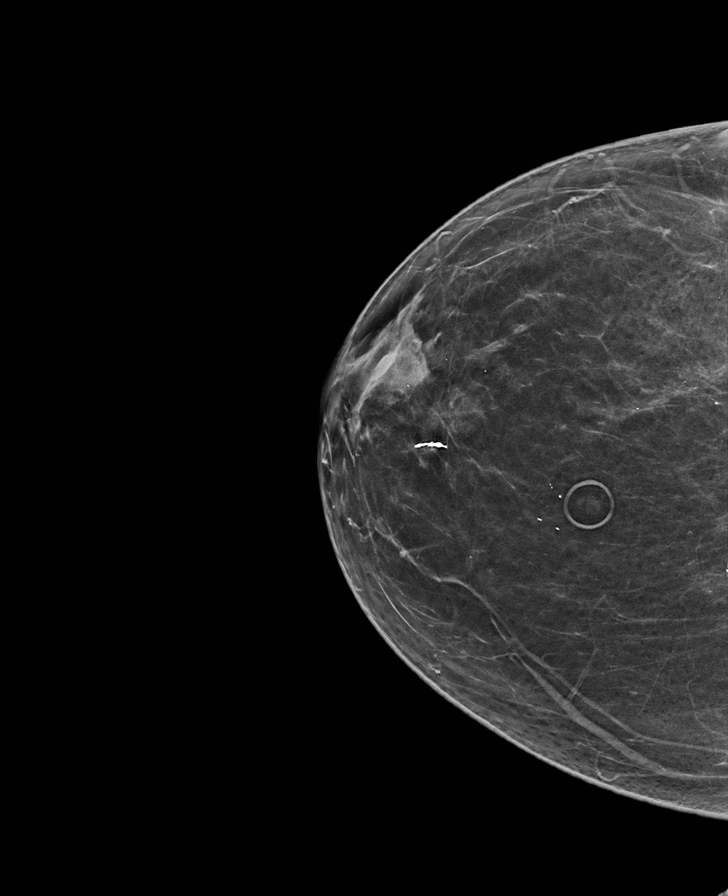

[L MLO tomo · tomo slice 43/84.0]
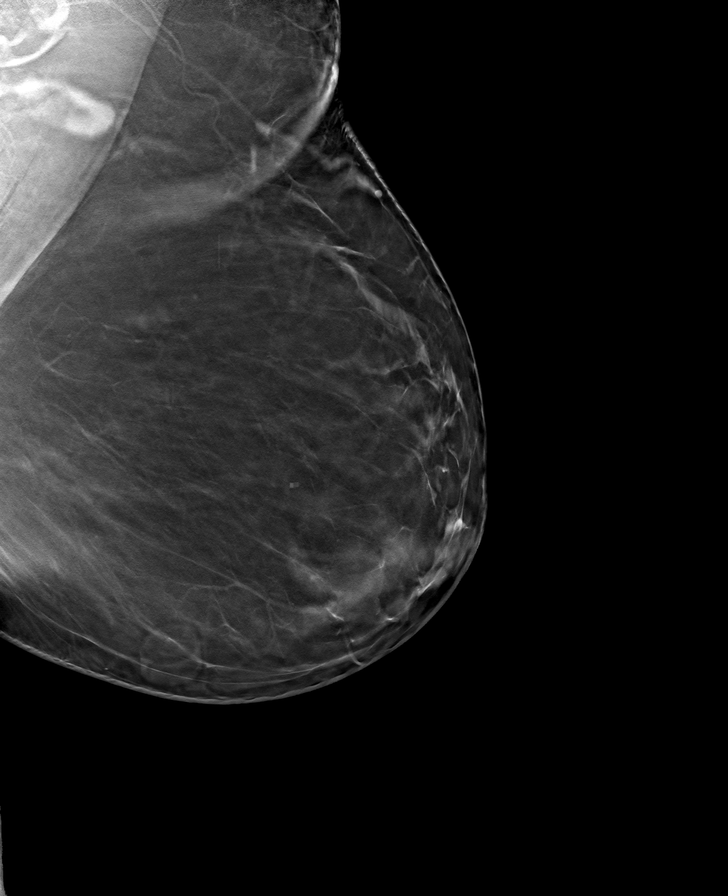

[R CC tomo · tomo slice 35/69.0]
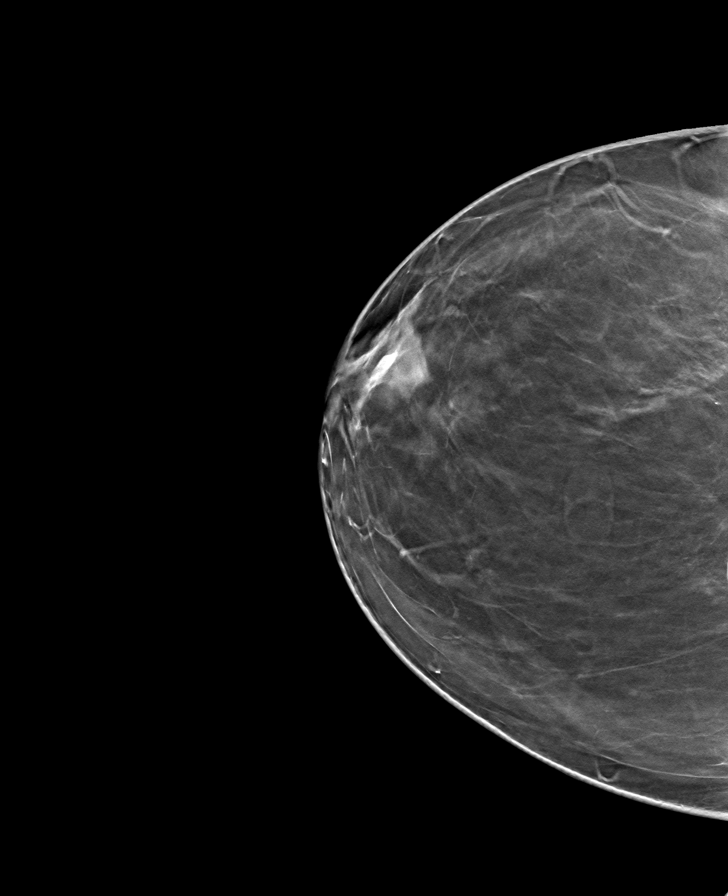

[R MLO tomo · tomo slice 41/80.0]
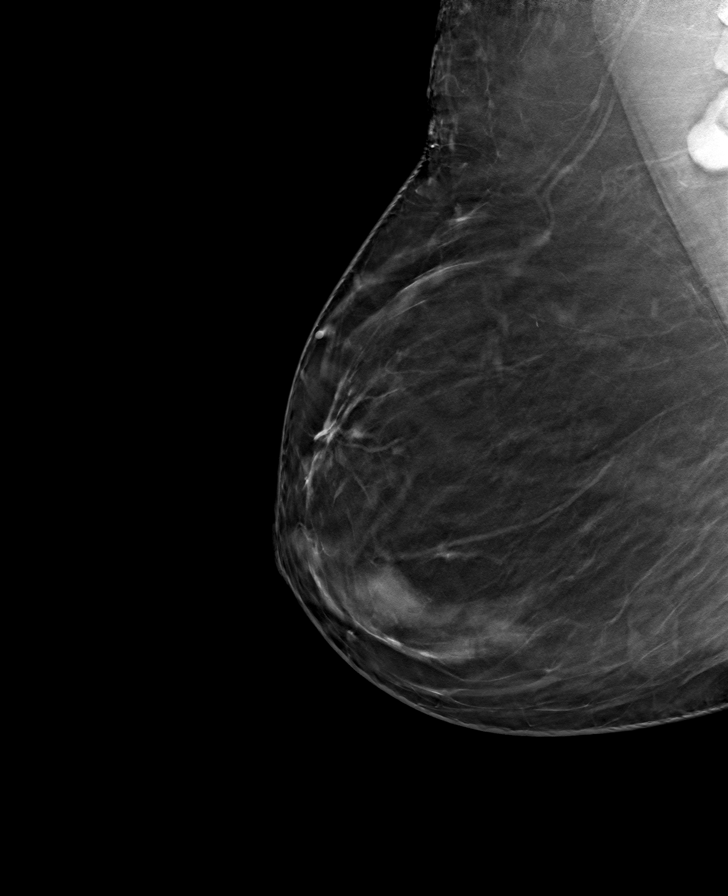

[L CC tomo · tomo slice 37/72.0]
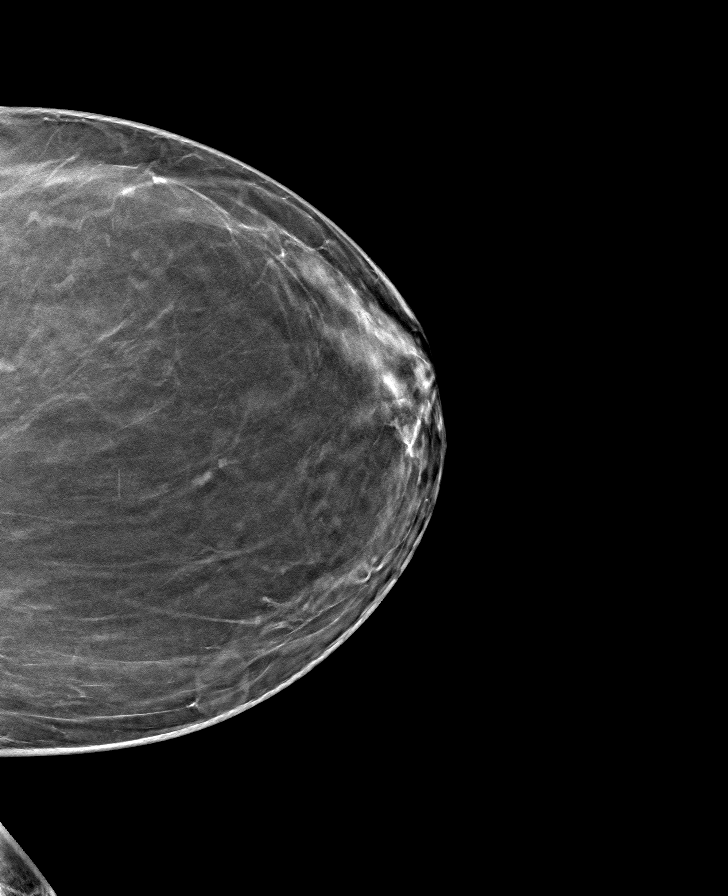

[8 of 24 positions shown; findings below may reference images not displayed]

ACR Breast Density Category b: There are scattered areas of
fibroglandular density.
FINDINGS: There are no findings suspicious for malignancy.
IMPRESSION: No mammographic evidence of malignancy. A result letter of this
screening mammogram will be mailed directly to the patient.

RECOMMENDATION:
Screening mammogram in one year. (Code:51-O-LD2)

BI-RADS CATEGORY  1: Negative.

## 2023-08-22 ENCOUNTER — Other Ambulatory Visit: Payer: Self-pay | Admitting: Family Medicine

## 2023-08-22 DIAGNOSIS — Z1231 Encounter for screening mammogram for malignant neoplasm of breast: Secondary | ICD-10-CM

## 2023-09-19 ENCOUNTER — Ambulatory Visit
Admission: RE | Admit: 2023-09-19 | Discharge: 2023-09-19 | Disposition: A | Discharge status: Home / Self Care | Source: Ambulatory Visit | Attending: Family Medicine | Admitting: Family Medicine

## 2023-09-19 DIAGNOSIS — Z1231 Encounter for screening mammogram for malignant neoplasm of breast: Secondary | ICD-10-CM
